# Patient Record
Sex: Female | Born: 1965
Health system: Southern US, Community
[De-identification: ages and names within clinical notes are randomized; demographics above are authoritative.]

## PROBLEM LIST (undated history)

## (undated) DIAGNOSIS — N6452 Nipple discharge: Secondary | ICD-10-CM

## (undated) DIAGNOSIS — D216 Benign neoplasm of connective and other soft tissue of trunk, unspecified: Secondary | ICD-10-CM

## (undated) HISTORY — PX: TONSILLECTOMY: SUR1361

## (undated) HISTORY — DX: Nipple discharge: N64.52

## (undated) HISTORY — DX: Benign neoplasm of connective and other soft tissue of trunk, unspecified: D21.6

---

## 1991-05-14 HISTORY — PX: CHOLECYSTECTOMY: SHX55

## 2003-05-14 HISTORY — PX: BREAST BIOPSY: SHX20

## 2007-05-14 HISTORY — PX: CERVICAL CONE BIOPSY: SUR198

## 2007-05-14 HISTORY — PX: VAGINA SURGERY: SHX829

## 2009-07-10 ENCOUNTER — Emergency Department (HOSPITAL_COMMUNITY): Admission: EM | Admit: 2009-07-10 | Discharge: 2009-07-11 | Payer: Self-pay | Admitting: Emergency Medicine

## 2012-05-13 DIAGNOSIS — N6452 Nipple discharge: Secondary | ICD-10-CM

## 2012-05-13 HISTORY — DX: Nipple discharge: N64.52

## 2012-06-16 ENCOUNTER — Ambulatory Visit: Payer: Self-pay

## 2012-12-16 ENCOUNTER — Ambulatory Visit: Payer: Self-pay

## 2013-01-18 ENCOUNTER — Ambulatory Visit (INDEPENDENT_AMBULATORY_CARE_PROVIDER_SITE_OTHER): Payer: PRIVATE HEALTH INSURANCE | Admitting: General Surgery

## 2013-01-18 ENCOUNTER — Encounter: Payer: Self-pay | Admitting: General Surgery

## 2013-01-18 VITALS — BP 124/68 | HR 80 | Resp 12 | Ht 65.0 in | Wt 236.0 lb

## 2013-01-18 DIAGNOSIS — N63 Unspecified lump in unspecified breast: Secondary | ICD-10-CM | POA: Insufficient documentation

## 2013-01-18 NOTE — Patient Instructions (Signed)
Patient to return in 6 months with bilateral mammogram.

## 2013-01-18 NOTE — Progress Notes (Signed)
Patient ID: Tara Haas, female   DOB: 1965/09/02, 47 y.o.   MRN: [id number]  Chief Complaint  Patient presents with  . Follow-up    left diagnostic mammogram     HPI Tara Haas is a 47 y.o. female who presents for a breast evaluation. The most recent left diagnostic mammogram was done on 12/16/12 with a birad category 3. Patient does perform regular self breast checks and gets regular mammograms done.  No new problems with the breasts. The patient reports that there has not been as much drainage from the nipple since oral stimulation was discontinued.   HPI  Past Medical History  Diagnosis Date  . 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 Nipple discharge 2014    bilateral    Past Surgical History  Procedure Laterality Date  . Breast biopsy Left 2005  . Vagina surgery  2009    conization  . Cervical cone biopsy  2009  . Cholecystectomy  1993  . Tonsillectomy  as child    Family History  Problem Relation Age of Onset  . Lymphoma Father   . Throat cancer Maternal Grandmother 75    Social History History  Substance Use Topics  . Smoking status: Current Every Day Smoker -- 1.00 packs/day for 25 years    Types: Cigarettes  . Smokeless tobacco: Never Used  . Alcohol Use: Yes    No Known Allergies  Current Outpatient Prescriptions  Medication Sig Dispense Refill  . Boswellia-Glucosamine-Vit D (GLUCOSAMINE COMPLEX PO) Take 1 tablet by mouth daily.      . ferrous gluconate (FERGON) 325 MG tablet Take 325 mg by mouth daily with breakfast.      . VITAMIN D, CHOLECALCIFEROL, PO Take 1 tablet by mouth daily.       No current facility-administered medications for this visit.    Review of Systems Review of Systems  Constitutional: Negative.   Respiratory: Negative.   Cardiovascular: Negative.     Blood pressure 124/68, pulse 80, resp. rate 12, height 5\' 5"  (1.651 m), weight 236 lb (107.049 kg), last menstrual period  01/14/2013.  Physical Exam Physical Exam  Constitutional: She is oriented to person, place, and time. She appears well-developed and well-nourished.  Cardiovascular: Normal rate, regular rhythm and normal heart sounds.   No murmur heard. Pulmonary/Chest: Effort normal and breath sounds normal. Right breast exhibits no inverted nipple, no mass, no nipple discharge, no skin change and no tenderness. Left breast exhibits no inverted nipple, no mass, no nipple discharge, no skin change and no tenderness.  Left breast smaller than right breast.   Lymphadenopathy:    She has no cervical adenopathy.    She has no axillary adenopathy.  Neurological: She is alert and oriented to person, place, and time.  Skin: Skin is warm and dry.    Data Reviewed Left breast mammogram dated 12/21/2012 showed no interval change. Stable nodular it. BI-RAD-3.  Assessment    Stable breast exam.    Plan    Arrangements will made for followup bilateral mammograms in 6 months for a final check.       Earline Mayotte 01/19/2013, 7:39 AM

## 2013-08-17 ENCOUNTER — Telehealth: Payer: Self-pay

## 2013-08-17 NOTE — Telephone Encounter (Signed)
Multiple attempts to contact the patient have been made by [facility] without success. Message left for patient to call to set up follow up mammogram and office visit. Attempt to contact letter mailed to patient with our contact information as well as Christy at [employer] information in case patient does not have insurance.

## 2013-12-22 ENCOUNTER — Ambulatory Visit: Payer: Self-pay

## 2014-03-14 ENCOUNTER — Encounter: Payer: Self-pay | Admitting: General Surgery

## 2014-11-19 IMAGING — US ULTRASOUND LEFT BREAST
1 series · 13 of 25 positions shown · non-contrast
Comparison: none

REASON FOR EXAM: left breast nodule 11 o'clock; spontaneous blue left
nipple discharge x 6 months
COMMENTS:

PROCEDURE:     US  - US LT BREAST ([REDACTED])  - June 16, 2012  [DATE]
RESULT:     History: Left breast nodule and nipple discharge.

[Series 1: ultrasound left breast · 0.08mm/px · 13 of 25 slices shown]
[im 1/25]
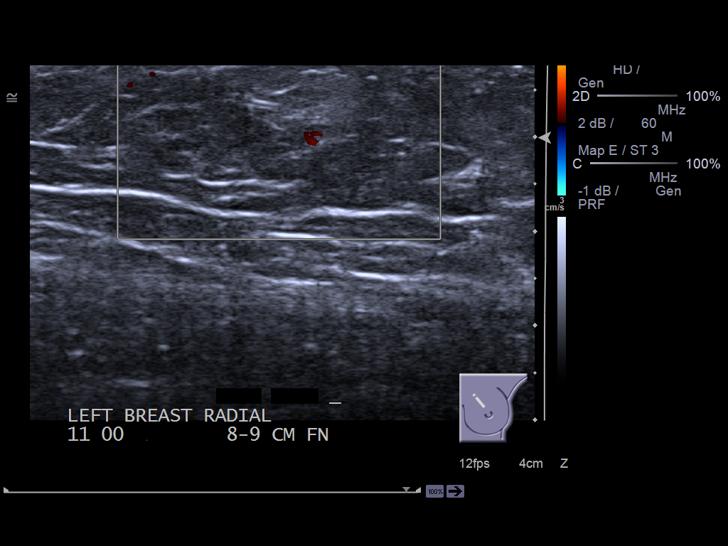
[im 3/25]
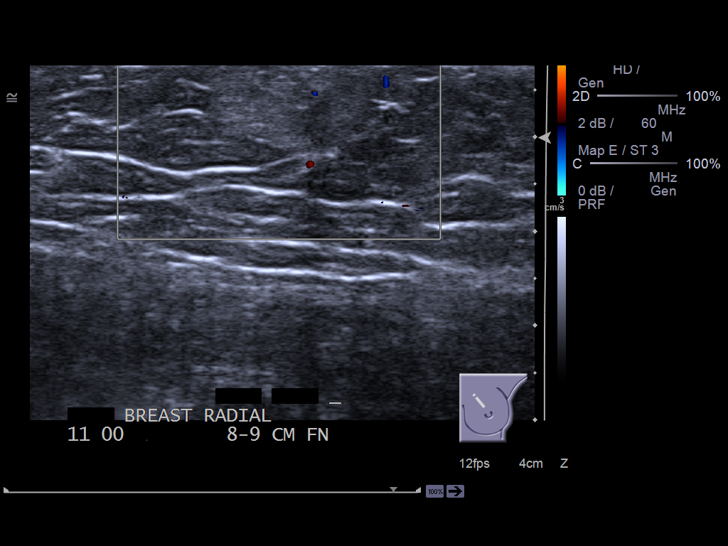
[im 5/25]
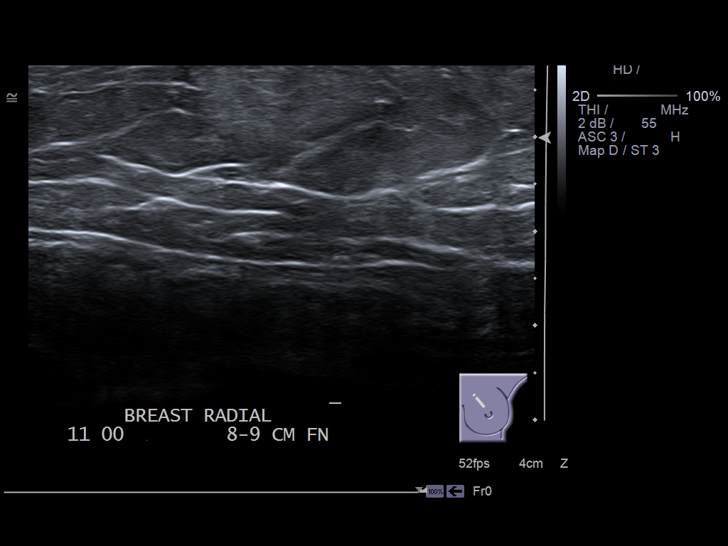
[im 7/25]
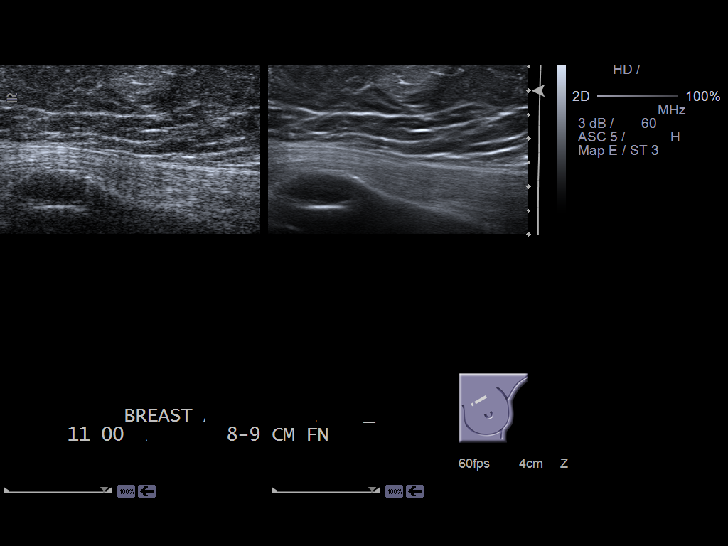
[im 9/25]
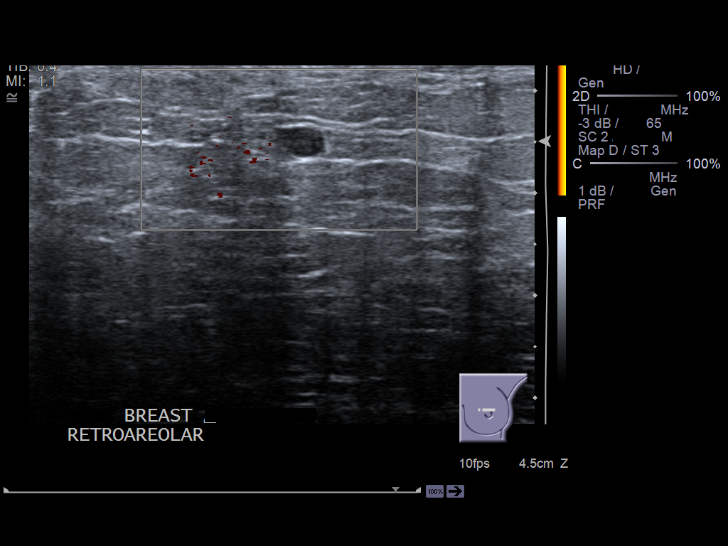
[im 11/25]
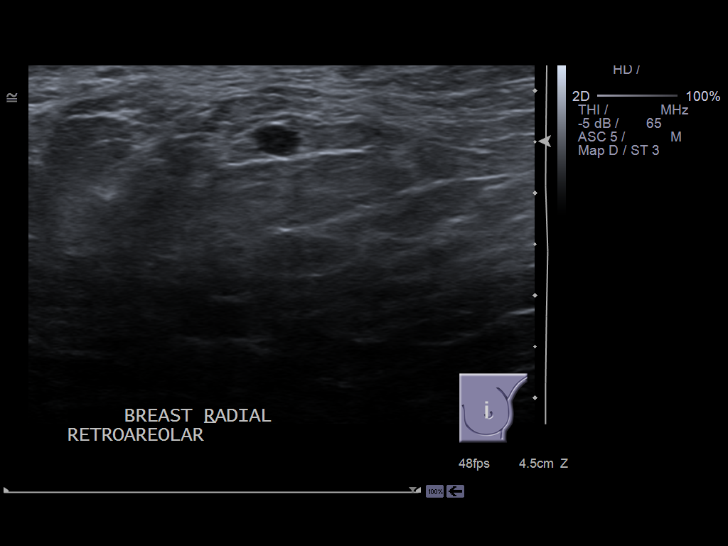
[im 13/25]
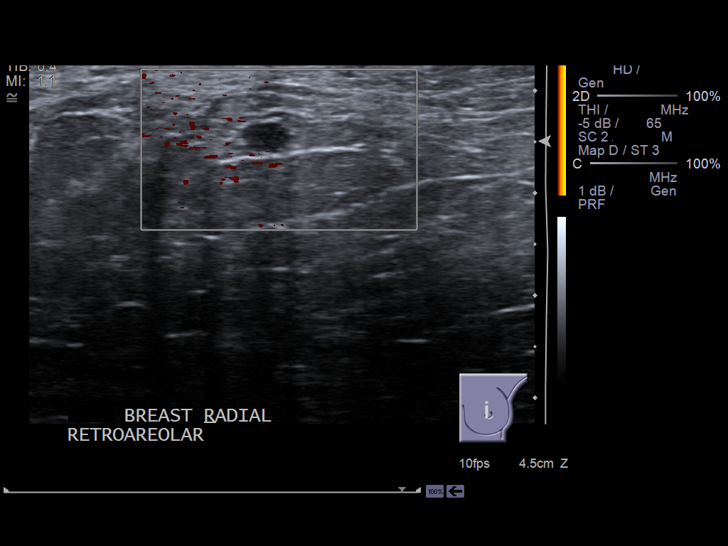
[im 15/25]
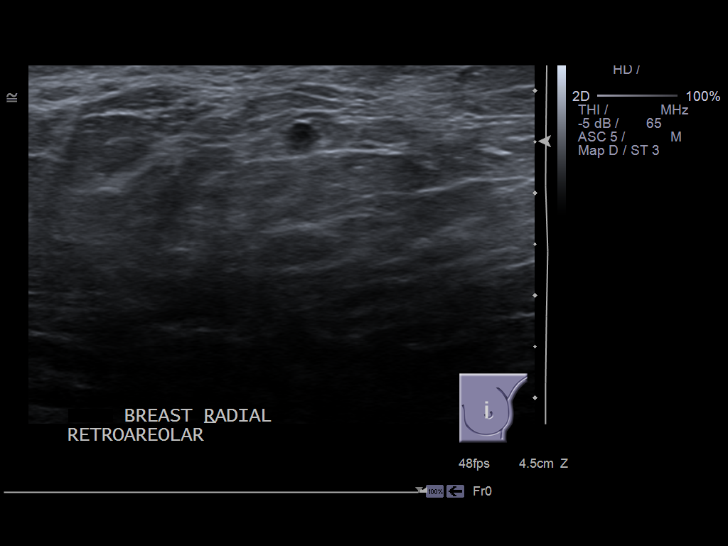
[im 17/25]
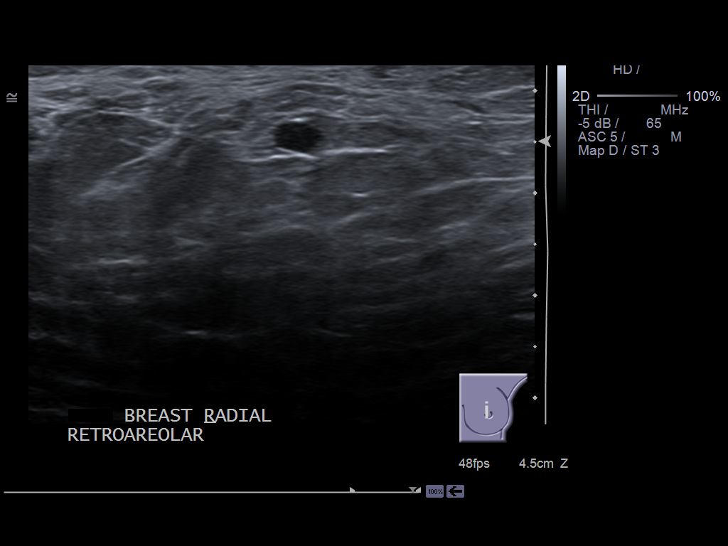
[im 19/25]
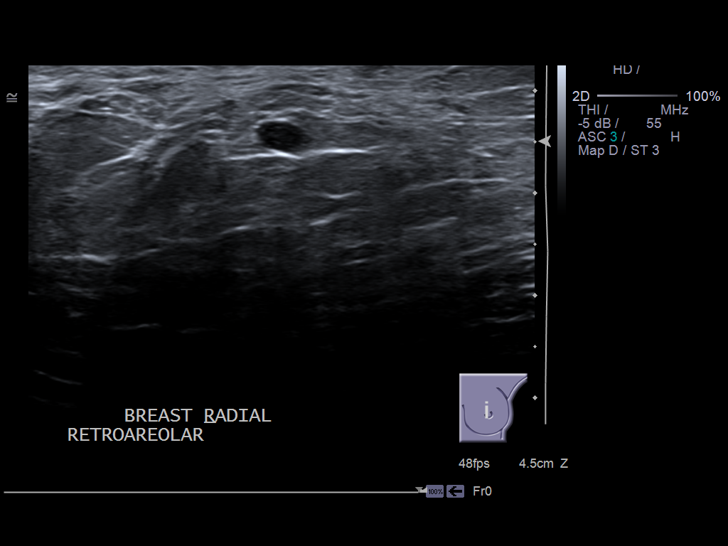
[im 21/25]
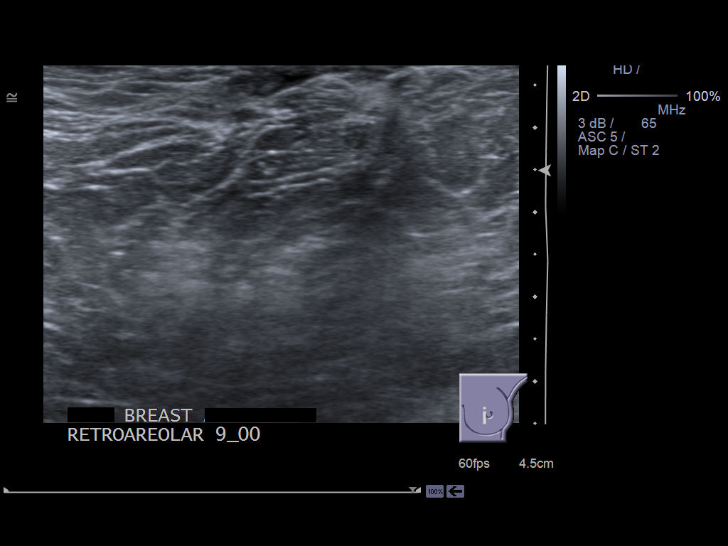
[im 23/25]
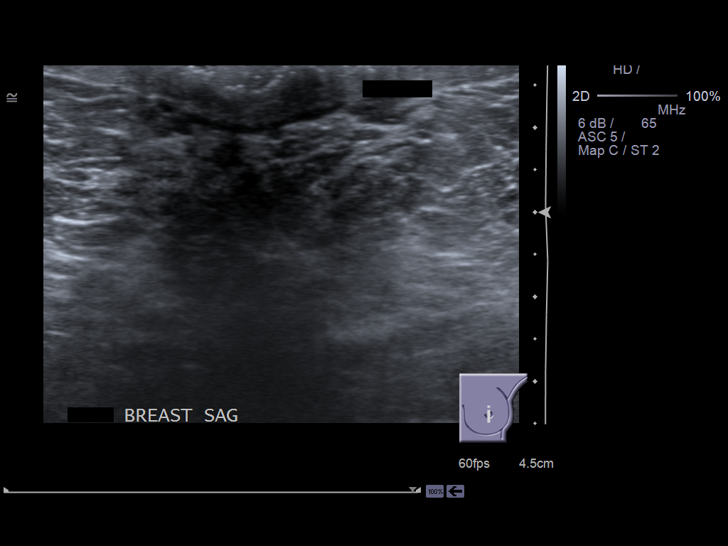
[im 25/25]
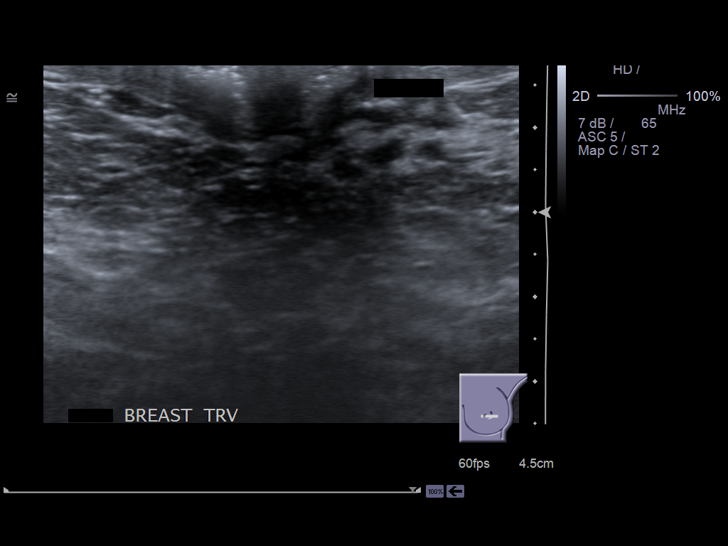

[13 of 25 positions shown; findings below may reference images not displayed]

FINDINGS: Left breast ultrasound reveals a 5 mm hypoechoic nodule at [DATE]
in the retroareolar portion. Mammography of this region reveals slight
nodularity with a tiny nodule at [DATE] in the retroareolar portion left
breast. Surgical evaluation of this nodule is suggested as a tiny malignancy
cannot be excluded. There is a hyperechoic subcutaneous lesion at [DATE] in
the left breast measuring approximately 1.5 cm. This may represent fat
collection or small lipoma. However given that this is palpable surgical
removal of this palpable lesion is also suggested. The breast are nodular
bilaterally. Multiple parenchymal nodules are noted bilaterally. Further
evaluation of these parenchymal nodulesshould be performed in 6 months with
a followup mammogram to assure stability. No pathologic calcifications. CAD
evaluation nonfocal.
IMPRESSION: 1. Two solid nodules left breast for which surgical removal suggested.
2. Bilateral breast nodularity for which followup mammography in 6 months
suggested demonstrate stability.

BI-RADS: Category 4 - Suspicious Abnormality - Biopsy Should Be Considered

There BREAST COMPOSITION: The breast composition is SCATTERED FIBROGLANDULAR
TISSUE (glandular tissue is 25-50%)

## 2020-04-25 ENCOUNTER — Other Ambulatory Visit: Payer: Self-pay

## 2020-04-25 DIAGNOSIS — Z20822 Contact with and (suspected) exposure to covid-19: Secondary | ICD-10-CM

## 2020-04-27 ENCOUNTER — Telehealth: Payer: Self-pay | Admitting: General Practice

## 2020-04-27 LAB — SARS-COV-2, NAA 2 DAY TAT

## 2020-04-27 LAB — NOVEL CORONAVIRUS, NAA: SARS-CoV-2, NAA: NOT DETECTED

## 2020-04-27 NOTE — Telephone Encounter (Signed)
Negative COVID results given. Patient results "NOT Detected." Caller expressed understanding. ° °
